# Patient Record
Sex: Female | Born: 1965 | Race: White | Hispanic: No | Marital: Married | Smoking: Current every day smoker
Health system: Southern US, Community
[De-identification: ages and names within clinical notes are randomized; demographics above are authoritative.]

## PROBLEM LIST (undated history)

## (undated) DIAGNOSIS — G43909 Migraine, unspecified, not intractable, without status migrainosus: Secondary | ICD-10-CM

## (undated) HISTORY — PX: TMJ ARTHROPLASTY: SHX1066

## (undated) HISTORY — PX: BREAST ENHANCEMENT SURGERY: SHX7

---

## 2002-05-14 ENCOUNTER — Encounter: Payer: Self-pay | Admitting: Emergency Medicine

## 2002-05-14 ENCOUNTER — Emergency Department (HOSPITAL_COMMUNITY): Admission: EM | Admit: 2002-05-14 | Discharge: 2002-05-14 | Payer: Self-pay | Admitting: Emergency Medicine

## 2004-03-22 ENCOUNTER — Other Ambulatory Visit: Admission: RE | Admit: 2004-03-22 | Discharge: 2004-03-22 | Payer: Self-pay | Admitting: Obstetrics and Gynecology

## 2004-12-26 ENCOUNTER — Emergency Department (HOSPITAL_COMMUNITY): Admission: EM | Admit: 2004-12-26 | Discharge: 2004-12-26 | Payer: Self-pay | Admitting: Emergency Medicine

## 2009-01-26 ENCOUNTER — Emergency Department (HOSPITAL_COMMUNITY): Admission: EM | Admit: 2009-01-26 | Discharge: 2009-01-26 | Payer: Self-pay | Admitting: Emergency Medicine

## 2010-07-23 ENCOUNTER — Encounter: Admission: RE | Admit: 2010-07-23 | Discharge: 2010-07-23 | Payer: Self-pay | Admitting: General Surgery

## 2010-07-26 ENCOUNTER — Ambulatory Visit (HOSPITAL_BASED_OUTPATIENT_CLINIC_OR_DEPARTMENT_OTHER): Admission: RE | Admit: 2010-07-26 | Discharge: 2010-07-26 | Payer: Self-pay | Admitting: General Surgery

## 2011-02-18 LAB — POCT HEMOGLOBIN-HEMACUE: Hemoglobin: 13 g/dL (ref 12.0–15.0)

## 2011-07-15 ENCOUNTER — Emergency Department (HOSPITAL_COMMUNITY)
Admission: EM | Admit: 2011-07-15 | Discharge: 2011-07-15 | Disposition: A | Discharge status: Home / Self Care | Payer: Medicaid Other | Attending: Emergency Medicine | Admitting: Emergency Medicine

## 2011-07-15 DIAGNOSIS — K029 Dental caries, unspecified: Secondary | ICD-10-CM | POA: Insufficient documentation

## 2011-07-15 DIAGNOSIS — K089 Disorder of teeth and supporting structures, unspecified: Secondary | ICD-10-CM | POA: Insufficient documentation

## 2011-07-15 DIAGNOSIS — K047 Periapical abscess without sinus: Secondary | ICD-10-CM | POA: Insufficient documentation

## 2011-10-11 ENCOUNTER — Emergency Department (HOSPITAL_COMMUNITY)
Admission: EM | Admit: 2011-10-11 | Discharge: 2011-10-11 | Disposition: A | Discharge status: Other Acute Inpatient Hospital | Payer: Medicaid Other | Attending: Emergency Medicine | Admitting: Emergency Medicine

## 2011-10-11 ENCOUNTER — Encounter: Payer: Self-pay | Admitting: *Deleted

## 2011-10-11 DIAGNOSIS — R51 Headache: Secondary | ICD-10-CM | POA: Insufficient documentation

## 2011-10-11 DIAGNOSIS — M26609 Unspecified temporomandibular joint disorder, unspecified side: Secondary | ICD-10-CM | POA: Insufficient documentation

## 2011-10-11 DIAGNOSIS — H9209 Otalgia, unspecified ear: Secondary | ICD-10-CM | POA: Insufficient documentation

## 2011-10-11 MED ORDER — HYDROCODONE-ACETAMINOPHEN 5-500 MG PO TABS: 1.0000 | ORAL_TABLET | Freq: Four times a day (QID) | ORAL | Status: AC | PRN

## 2011-10-11 NOTE — ED Provider Notes (Signed)
History     CSN: [id number] Arrival date & time: 10/11/2011  9:42 AM   First MD Initiated Contact with Patient 10/11/11 [phone]      Chief Complaint  Patient presents with  . Headache    (Consider location/radiation/quality/duration/timing/severity/associated sxs/prior treatment) Patient is a 45 y.o. female presenting with headaches.  Headache  This is a new problem. The current episode started more than 1 week ago. Episode frequency: at night only. The problem has not changed since onset.Associated with: opening mouth, biting down. The pain is located in the right unilateral and temporal region. The pain is at a severity of 8/10. The pain is moderate. The pain radiates to the face. Pertinent negatives include no anorexia, no fever, no nausea and no vomiting. She has tried NSAIDs for the symptoms. The treatment provided mild relief.  pt states she had similar pain a month ago, thought it was related to a bad tooth. States had that tooth extracted, pain returned. States it is worse at night. Admits to grinding teeth at night. 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 States has not bothered her until now. Denies visual changes, pain in her neck, nausea, vomiting, dizziness, naumbness or weakness in UE or LE, fever.  History reviewed. No pertinent past medical history.  Past Surgical History  Procedure Date  . Breast enhancement surgery   . Tmj arthroplasty     No family history on file.  History  Substance Use Topics  . Smoking status: Current Everyday Smoker  . Smokeless tobacco: Not on file  . Alcohol Use: No    OB History    Grav Para Term Preterm Abortions TAB SAB Ect Mult Living                  Review of Systems  Constitutional: Negative.  Negative for fever.  HENT: Positive for ear pain. Negative for hearing loss, congestion, sore throat, facial swelling, mouth sores, trouble swallowing, neck pain, voice change and tinnitus.    Eyes: Negative.   Respiratory: Negative.   Cardiovascular: Negative.   Gastrointestinal: Negative.  Negative for nausea, vomiting and anorexia.  Genitourinary: Negative.   Musculoskeletal: Negative.   Skin: Negative.   Neurological: Positive for headaches. Negative for dizziness, facial asymmetry, speech difficulty and numbness.  Psychiatric/Behavioral: Negative.     Allergies  Haldol  Home Medications   Current Outpatient Rx  Name Route Sig Dispense Refill  . BUPROPION HCL ER (SR) 200 MG PO TB12 Oral Take 200 mg by mouth 2 (two) times daily.      . IBUPROFEN 200 MG PO TABS Oral Take 800 mg by mouth every 6 (six) hours as needed. For pain       BP 112/60  Pulse 66  Temp(Src) 98.5 F (36.9 C) (Oral)  SpO2 100%  Physical Exam  Constitutional: She is oriented to person, place, and time. She appears well-developed and well-nourished. No distress.  HENT:  Head: Normocephalic and atraumatic.  Right Ear: External ear normal.  Left Ear: External ear normal.  Nose: Nose normal.  Mouth/Throat: Oropharynx is clear and moist.       Tenderness with palpation over right TMJ. Full ROM of the jaw. No crepitus of TMJ palpated. Normal dentition and gums  Eyes: Conjunctivae and EOM are normal. Pupils are equal, round, and reactive to light.  Neck: Neck supple.  Cardiovascular: Normal rate, regular rhythm, normal heart sounds and intact distal pulses.   Pulmonary/Chest:  Effort normal and breath sounds normal.  Abdominal: Soft. Bowel sounds are normal. There is no tenderness.  Musculoskeletal: Normal range of motion.  Neurological: She is alert and oriented to person, place, and time. She has normal reflexes. No cranial nerve deficit. Coordination normal.       Normal and equal grip strength bilaterally. Gait normal. No pronator drift.  Skin: Skin is warm and dry.  Psychiatric: She has a normal mood and affect. Her behavior is normal.    ED Course  Procedures (including critical care  time)  Normal neurological exam. Pain reporducible with TMJ palpation and jaw movement. Suspect TMJ syndrome VS trigeminal neuralgia. WIll follow up with ENT.   MDM          Lottie Mussel, PA 10/11/11 1025

## 2011-10-11 NOTE — ED Provider Notes (Signed)
Medical screening examination/treatment/procedure(s) were performed by non-physician practitioner and as supervising physician I was immediately available for consultation/collaboration.   Lyanne Co, MD 10/11/11 [phone]

## 2011-10-11 NOTE — ED Notes (Addendum)
Patient reports she had a right sided headache that woke her today.

## 2012-03-26 ENCOUNTER — Other Ambulatory Visit: Payer: Self-pay | Admitting: Obstetrics and Gynecology

## 2012-05-09 IMAGING — CR DG CHEST 2V
2 series · 2 of 2 positions shown · non-contrast
Comparison: None

CLINICAL DATA: Preop respiratory exam.  Hemorrhoids.

CHEST - 2 VIEW

[view not recorded (1 of 2)]
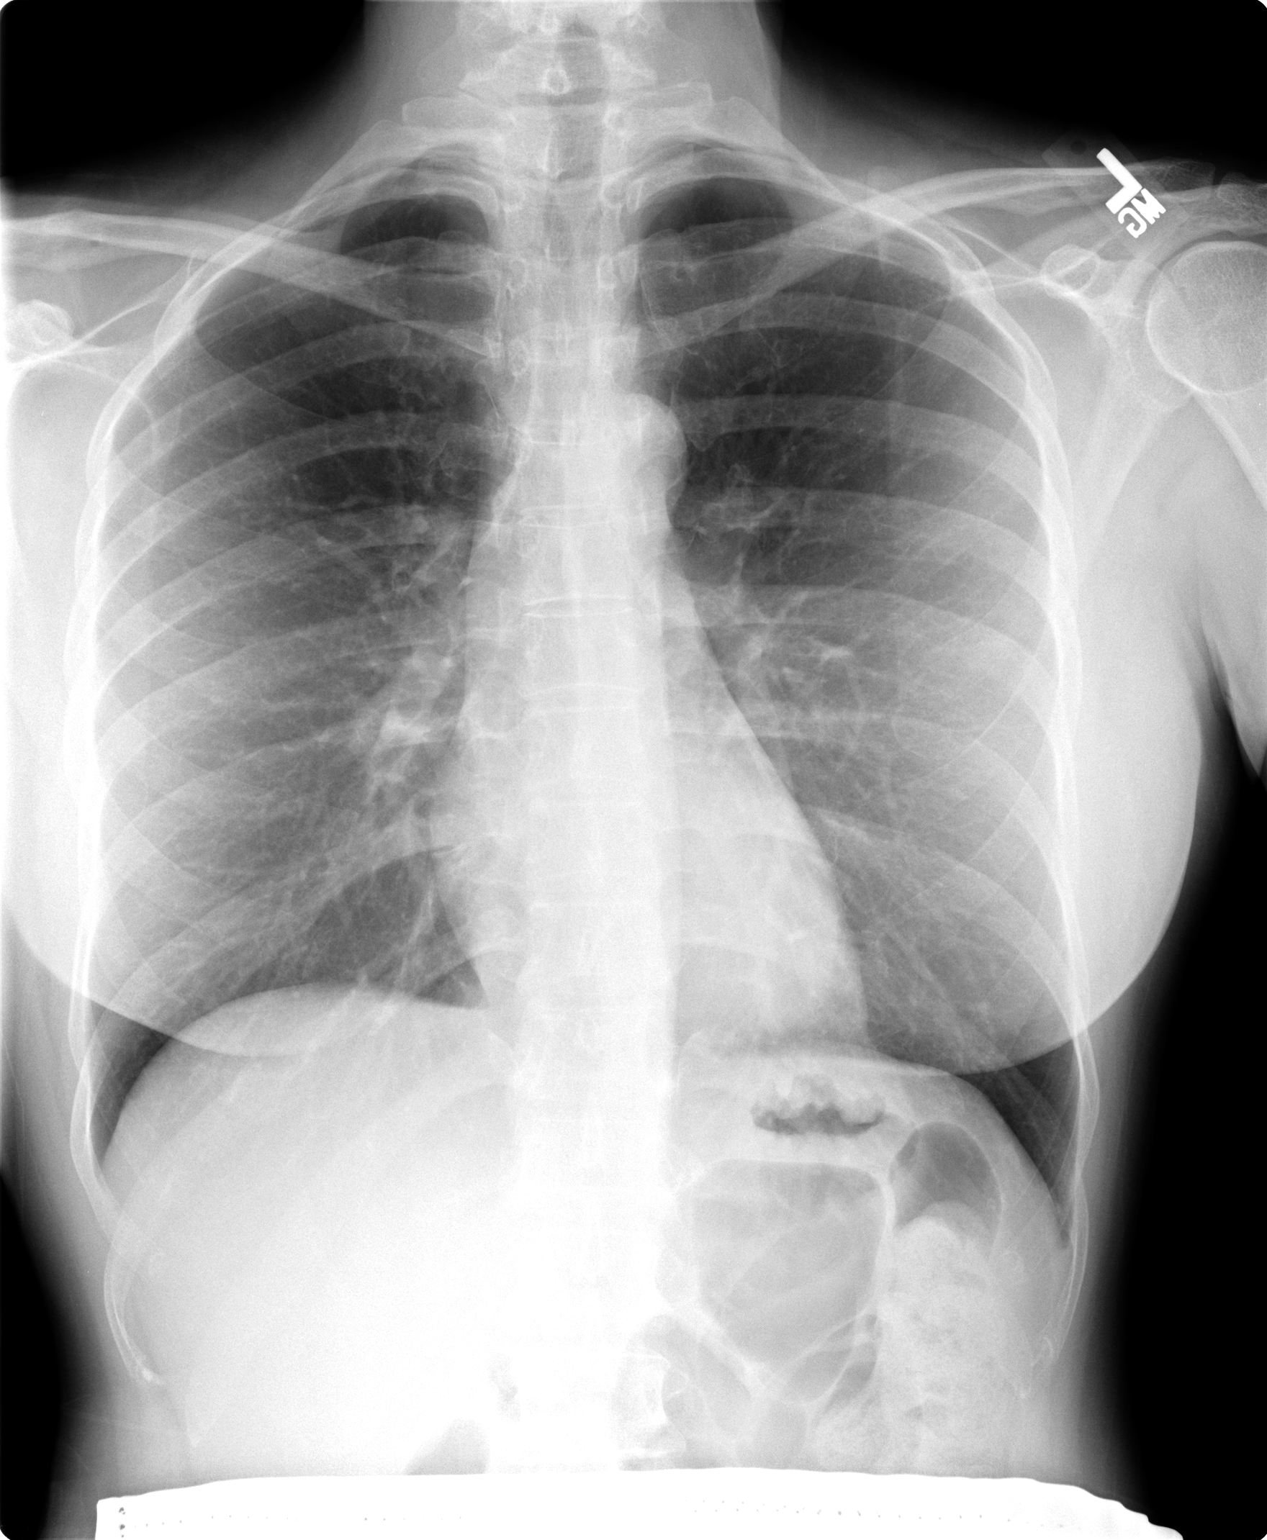

[view not recorded (2 of 2)]
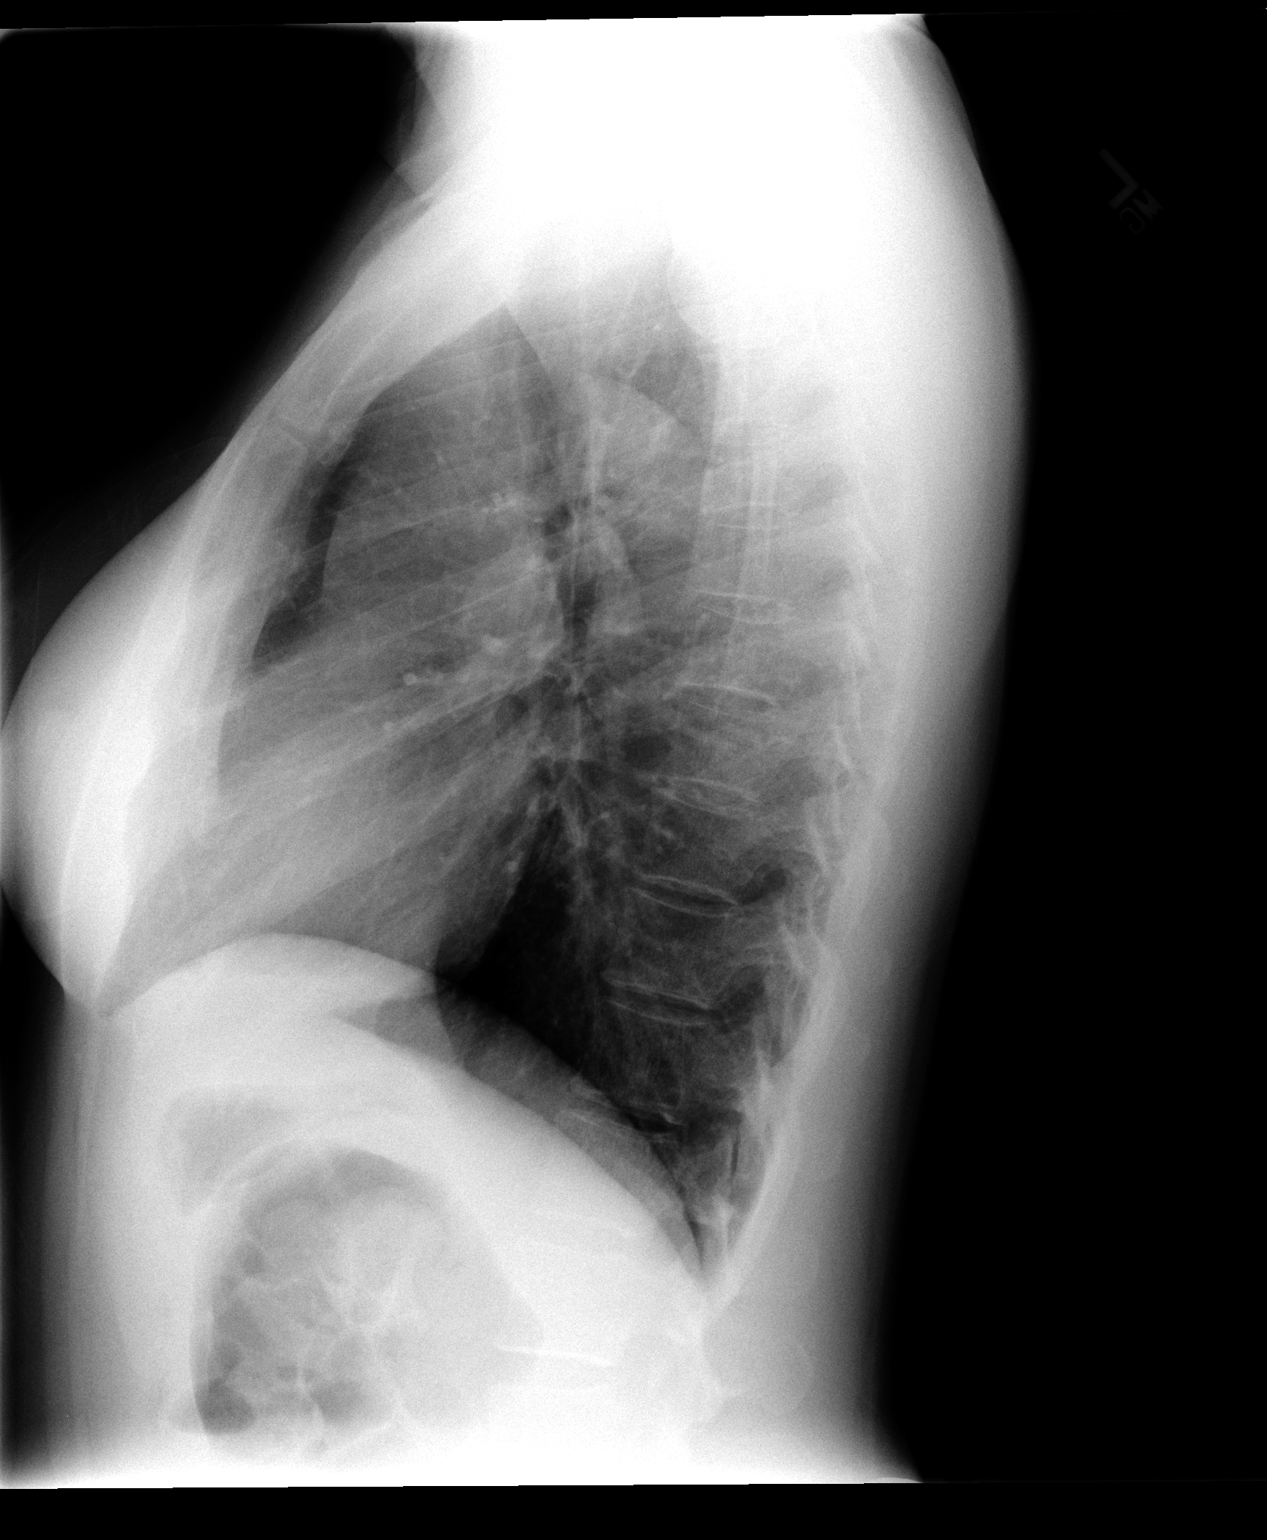

[2 of 2 positions shown; findings below may reference images not displayed]

FINDINGS: Heart size is normal.  Pulmonary vascularity is normal.
The lungs are clear without infiltrate or effusion.  There is no
mass lesion.  No bony abnormalities detected.
IMPRESSION: Negative

## 2013-10-29 ENCOUNTER — Other Ambulatory Visit: Payer: Self-pay | Admitting: Obstetrics and Gynecology

## 2013-10-30 ENCOUNTER — Other Ambulatory Visit: Payer: Self-pay | Admitting: Obstetrics and Gynecology

## 2013-10-30 DIAGNOSIS — Z9882 Breast implant status: Secondary | ICD-10-CM

## 2013-10-30 DIAGNOSIS — N644 Mastodynia: Secondary | ICD-10-CM

## 2013-11-12 ENCOUNTER — Ambulatory Visit
Admission: RE | Admit: 2013-11-12 | Discharge: 2013-11-12 | Disposition: A | Discharge status: Home / Self Care | Payer: Medicaid Other | Source: Ambulatory Visit | Attending: Obstetrics and Gynecology | Admitting: Obstetrics and Gynecology

## 2013-11-12 DIAGNOSIS — N644 Mastodynia: Secondary | ICD-10-CM

## 2013-11-12 DIAGNOSIS — Z9882 Breast implant status: Secondary | ICD-10-CM

## 2015-12-26 ENCOUNTER — Emergency Department (HOSPITAL_COMMUNITY): Payer: 59

## 2015-12-26 ENCOUNTER — Emergency Department (HOSPITAL_COMMUNITY)
Admission: EM | Admit: 2015-12-26 | Discharge: 2015-12-26 | Disposition: A | Discharge status: Home / Self Care | Payer: 59 | Attending: Emergency Medicine | Admitting: Emergency Medicine

## 2015-12-26 ENCOUNTER — Encounter (HOSPITAL_COMMUNITY): Payer: Self-pay | Admitting: *Deleted

## 2015-12-26 DIAGNOSIS — J069 Acute upper respiratory infection, unspecified: Secondary | ICD-10-CM | POA: Insufficient documentation

## 2015-12-26 DIAGNOSIS — Z8679 Personal history of other diseases of the circulatory system: Secondary | ICD-10-CM | POA: Insufficient documentation

## 2015-12-26 DIAGNOSIS — Z79899 Other long term (current) drug therapy: Secondary | ICD-10-CM | POA: Insufficient documentation

## 2015-12-26 DIAGNOSIS — F17210 Nicotine dependence, cigarettes, uncomplicated: Secondary | ICD-10-CM | POA: Insufficient documentation

## 2015-12-26 DIAGNOSIS — R51 Headache: Secondary | ICD-10-CM | POA: Diagnosis not present

## 2015-12-26 DIAGNOSIS — R05 Cough: Secondary | ICD-10-CM | POA: Diagnosis present

## 2015-12-26 DIAGNOSIS — R519 Headache, unspecified: Secondary | ICD-10-CM

## 2015-12-26 HISTORY — DX: Migraine, unspecified, not intractable, without status migrainosus: G43.909

## 2015-12-26 MED ORDER — BENZONATATE 100 MG PO CAPS: 100.0000 mg | ORAL_CAPSULE | Freq: Three times a day (TID) | ORAL | Status: AC

## 2015-12-26 MED ORDER — DEXAMETHASONE SODIUM PHOSPHATE 10 MG/ML IJ SOLN
10.0000 mg | Freq: Once | INTRAMUSCULAR | Status: AC
  Administered 2015-12-26: 10 mg via INTRAMUSCULAR
  Filled 2015-12-26: qty 1

## 2015-12-26 MED ORDER — ALBUTEROL SULFATE HFA 108 (90 BASE) MCG/ACT IN AERS
2.0000 | INHALATION_SPRAY | Freq: Once | RESPIRATORY_TRACT | Status: AC
  Administered 2015-12-26: 2 via RESPIRATORY_TRACT
  Filled 2015-12-26: qty 6.7

## 2015-12-26 NOTE — Discharge Instructions (Signed)
Tessalon as prescribed as needed for cough. Continue tylenol and/motrin for body aches and fever. Follow up with primary care doctor for recheck. Return if worsening.    Upper Respiratory Infection, Adult Most upper respiratory infections (URIs) are a viral infection of the air passages leading to the lungs. A URI affects the nose, throat, and upper air passages. The most common type of URI is nasopharyngitis and is typically referred to as "the common cold." URIs run their course and usually go away on their own. Most of the time, a URI does not require medical attention, but sometimes a bacterial infection in the upper airways can follow a viral infection. This is called a secondary infection. Sinus and middle ear infections are common types of secondary upper respiratory infections. Bacterial pneumonia can also complicate a URI. A URI can worsen asthma and chronic obstructive pulmonary disease (COPD). Sometimes, these complications can require emergency medical care and may be life threatening.  CAUSES Almost all URIs are caused by viruses. A virus is a type of germ and can spread from one person to another.  RISKS FACTORS You may be at risk for a URI if:   You smoke.   You have chronic heart or lung disease.  You have a weakened defense (immune) system.   You are very young or very old.   You have nasal allergies or asthma.  You work in crowded or poorly ventilated areas.  You work in health care facilities or schools. SIGNS AND SYMPTOMS  Symptoms typically develop 2-3 days after you come in contact with a cold virus. Most viral URIs last 7-10 days. However, viral URIs from the influenza virus (flu virus) can last 14-18 days and are typically more severe. Symptoms may include:   Runny or stuffy (congested) nose.   Sneezing.   Cough.   Sore throat.   Headache.   Fatigue.   Fever.   Loss of appetite.   Pain in your forehead, behind your eyes, and over your  cheekbones (sinus pain).  Muscle aches.  DIAGNOSIS  Your health care provider may diagnose a URI by:  Physical exam.  Tests to check that your symptoms are not due to another condition such as:  Strep throat.  Sinusitis.  Pneumonia.  Asthma. TREATMENT  A URI goes away on its own with time. It cannot be cured with medicines, but medicines may be prescribed or recommended to relieve symptoms. Medicines may help:  Reduce your fever.  Reduce your cough.  Relieve nasal congestion. HOME CARE INSTRUCTIONS   Take medicines only as directed by your health care provider.   Gargle warm saltwater or take cough drops to comfort your throat as directed by your health care provider.  Use a warm mist humidifier or inhale steam from a shower to increase air moisture. This may make it easier to breathe.  Drink enough fluid to keep your urine clear or pale yellow.   Eat soups and other clear broths and maintain good nutrition.   Rest as needed.   Return to work when your temperature has returned to normal or as your health care provider advises. You may need to stay home longer to avoid infecting others. You can also use a face mask and careful hand washing to prevent spread of the virus.  Increase the usage of your inhaler if you have asthma.   Do not use any tobacco products, including cigarettes, chewing tobacco, or electronic cigarettes. If you need help quitting, ask your health care  provider. PREVENTION  The best way to protect yourself from getting a cold is to practice good hygiene.   Avoid oral or hand contact with people with cold symptoms.   Wash your hands often if contact occurs.  There is no clear evidence that vitamin C, vitamin E, echinacea, or exercise reduces the chance of developing a cold. However, it is always recommended to get plenty of rest, exercise, and practice good nutrition.  SEEK MEDICAL CARE IF:   You are getting worse rather than better.    Your symptoms are not controlled by medicine.   You have chills.  You have worsening shortness of breath.  You have brown or red mucus.  You have yellow or brown nasal discharge.  You have pain in your face, especially when you bend forward.  You have a fever.  You have swollen neck glands.  You have pain while swallowing.  You have white areas in the back of your throat. SEEK IMMEDIATE MEDICAL CARE IF:   You have severe or persistent:  Headache.  Ear pain.  Sinus pain.  Chest pain.  You have chronic lung disease and any of the following:  Wheezing.  Prolonged cough.  Coughing up blood.  A change in your usual mucus.  You have a stiff neck.  You have changes in your:  Vision.  Hearing.  Thinking.  Mood. MAKE SURE YOU:   Understand these instructions.  Will watch your condition.  Will get help right away if you are not doing well or get worse.   This information is not intended to replace advice given to you by your health care provider. Make sure you discuss any questions you have with your health care provider.   Document Released: 05/17/2001 Document Revised: 04/07/2015 Document Reviewed: 02/26/2014 Elsevier Interactive Patient Education [employer].

## 2015-12-26 NOTE — ED Provider Notes (Signed)
CSN: [id number]     Arrival date & time 12/26/15  4098 History   First MD Initiated Contact with Patient 12/26/15 [phone]     Chief Complaint  Patient presents with  . Migraine  . Cough     (Consider location/radiation/quality/duration/timing/severity/associated sxs/prior Treatment) HPI Christie Hess is a 50 y.o. female  With a history of migraine headaches, presents to emergency department complaining of flulike symptoms and a headache. Patient states she has had cough, congestion, body aches, fever for 2 days. She reports fever 106.1 2 days ago. She states "I was hallucinating." patient reports taking TheraFlu day and night for her symptoms. She states this morning she woke up with one of her " cluster headaches." She states is typical for her prior cluster headache. Reports sharp pain to the right side of the head. She reports associated tunnel vision. She states when she developed a headache she decided to come to the ER. On the way to the hospital headache improved. Patient states her main concern now is the cough. She reports pain in her chest with coughing only. She reports yellow thick sputum. She denies any shortness of breath. No nausea, vomiting. No diarrhea. No abdominal pain. No swelling or pain in extremities.  Past Medical History  Diagnosis Date  . Migraines    Past Surgical History  Procedure Laterality Date  . Breast enhancement surgery    . Tmj arthroplasty     No family history on file. Social History  Substance Use Topics  . Smoking status: Current Every Day Smoker -- 0.50 packs/day    Types: Cigarettes  . Smokeless tobacco: None  . Alcohol Use: No   OB History    No data available     Review of Systems  Constitutional: Negative for fever and chills.  HENT: Positive for congestion and sore throat. Negative for ear pain.   Eyes: Negative.   Respiratory: Positive for cough. Negative for chest tightness and shortness of breath.   Cardiovascular: Negative for  chest pain, palpitations and leg swelling.  Gastrointestinal: Negative for nausea, vomiting, abdominal pain and diarrhea.  Genitourinary: Negative for dysuria, flank pain and pelvic pain.  Musculoskeletal: Negative for myalgias, arthralgias, neck pain and neck stiffness.  Skin: Negative for rash.  Neurological: Positive for headaches. Negative for dizziness and weakness.  All other systems reviewed and are negative.     Allergies  Haldol  Home Medications   Prior to Admission medications   Medication Sig Start Date End Date Taking? Authorizing Provider  buPROPion (WELLBUTRIN SR) 200 MG 12 hr tablet Take 200 mg by mouth 2 (two) times daily.      Historical Provider, MD  ibuprofen (ADVIL,MOTRIN) 200 MG tablet Take 800 mg by mouth every 6 (six) hours as needed. For pain     Historical Provider, MD   BP 137/79 mmHg  Pulse 77  Temp(Src) 99.1 F (37.3 C) (Oral)  Resp 18  Ht  (1.676 m)  Wt 61.236 kg  BMI 21.80 kg/m2  SpO2 98% Physical Exam  Constitutional: She is oriented to person, place, and time. She appears well-developed and well-nourished. No distress.  HENT:  Head: Normocephalic and atraumatic.  Right Ear: Tympanic membrane, external ear and ear canal normal.  Left Ear: Tympanic membrane, external ear and ear canal normal.  Nose: Nose normal.  Mouth/Throat: Uvula is midline, oropharynx is clear and moist and mucous membranes are normal.  Eyes: Conjunctivae and EOM are normal. Pupils are equal, round, and reactive to  light.  Neck: Neck supple.  Cardiovascular: Normal rate, regular rhythm and normal heart sounds.   Pulmonary/Chest: Effort normal and breath sounds normal. No respiratory distress. She has no wheezes. She has no rales.  Abdominal: Soft. Bowel sounds are normal. She exhibits no distension. There is no tenderness. There is no rebound.  Musculoskeletal: She exhibits no edema.  Neurological: She is alert and oriented to person, place, and time.  Skin: Skin is  warm and dry.  Psychiatric: She has a normal mood and affect. Her behavior is normal.  Nursing note and vitals reviewed.   ED Course  Procedures (including critical care time) Labs Review Labs Reviewed - No data to display  Imaging Review Dg Chest 2 View  12/26/2015  CLINICAL DATA:  Cough and fever EXAM: CHEST - 2 VIEW COMPARISON:  07/23/2010 FINDINGS: The heart size and mediastinal contours are within normal limits. Both lungs are clear. The visualized skeletal structures are unremarkable. IMPRESSION: No active disease. Electronically Signed   By: Alcide Clever M.D.   On: 12/26/2015 12:33   I have personally reviewed and evaluated these images and lab results as part of my medical decision-making.   EKG Interpretation None      MDM   Final diagnoses:  URI (upper respiratory infection)  Nonintractable headache, unspecified chronicity pattern, unspecified headache type    patient with flulike symptoms and headache. Headache actually improved on her way to the hospital. Her exam is consistent with viral URI. Chest x-ray obtained to rule out pneumonia and is negative. Patient treated emergency department with Decadron  IM.  We'll discharge home with cough medication, otherwise symptomatic treatment for her viral URI. Follow-up with primary care doctor. Return precautions discussed. Patient is afebrile here, normal vital signs.  Filed Vitals:   12/26/15 1029  BP: 137/79  Pulse: 77  Temp: 99.1 F (37.3 C)  TempSrc: Oral  Resp: 18  Height:  (1.676 m)  Weight: 61.236 kg  SpO2: 98%      Jaynie Crumble, PA-C 12/26/15 1615  Benjiman Core, MD 12/27/15 [phone]

## 2015-12-26 NOTE — ED Notes (Signed)
Pt ambulates independently and with steady gait at time of discharge. Discharge instructions and follow up information reviewed with patient. No other questions or concerns voiced at this time.  

## 2015-12-26 NOTE — ED Notes (Signed)
Pt with cough, headache, fever of 106.1 on Thursday.  States she had hallucinations.

## 2017-10-12 IMAGING — DX DG CHEST 2V
2 series · 2 of 2 positions shown · non-contrast
Comparison: 07/23/2010

CLINICAL DATA: Cough and fever

EXAM:
CHEST - 2 VIEW

[chest pa]
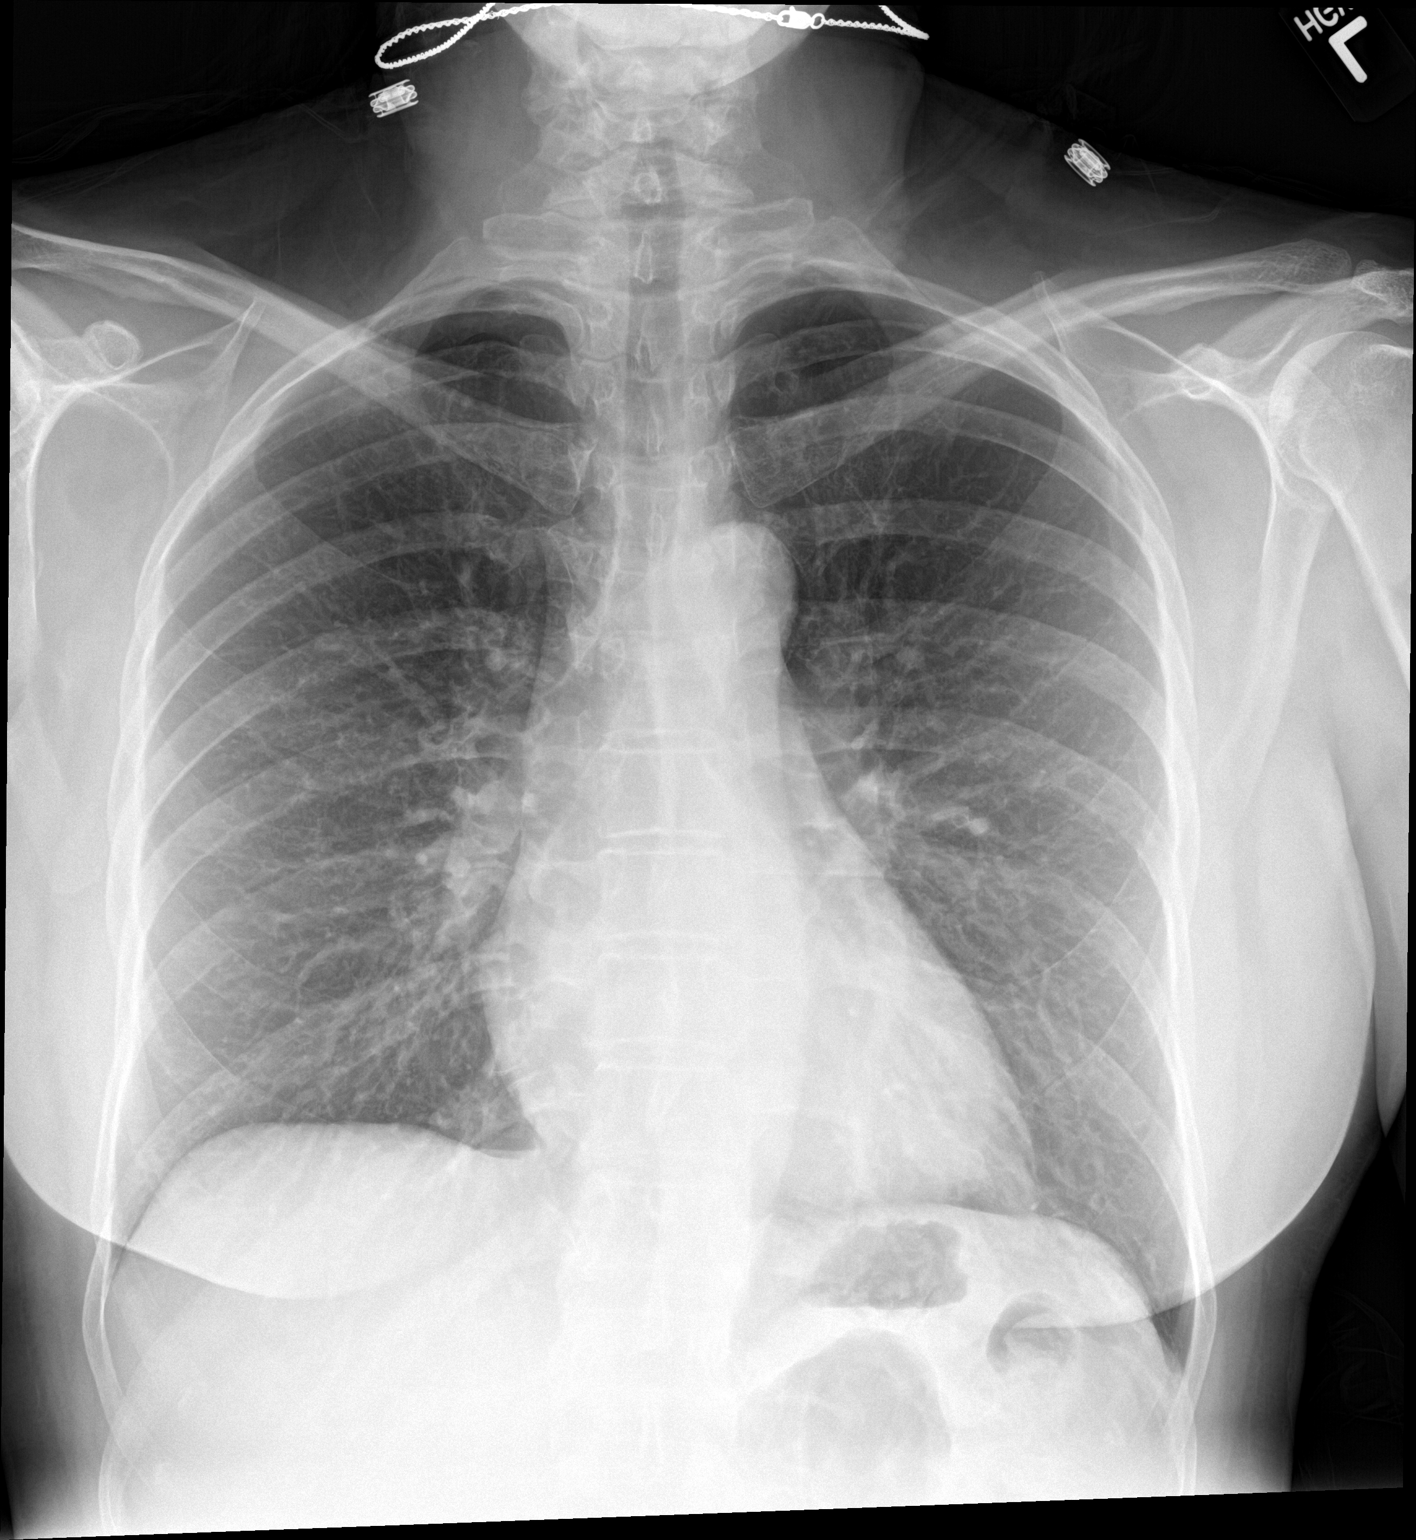

[chest lat]
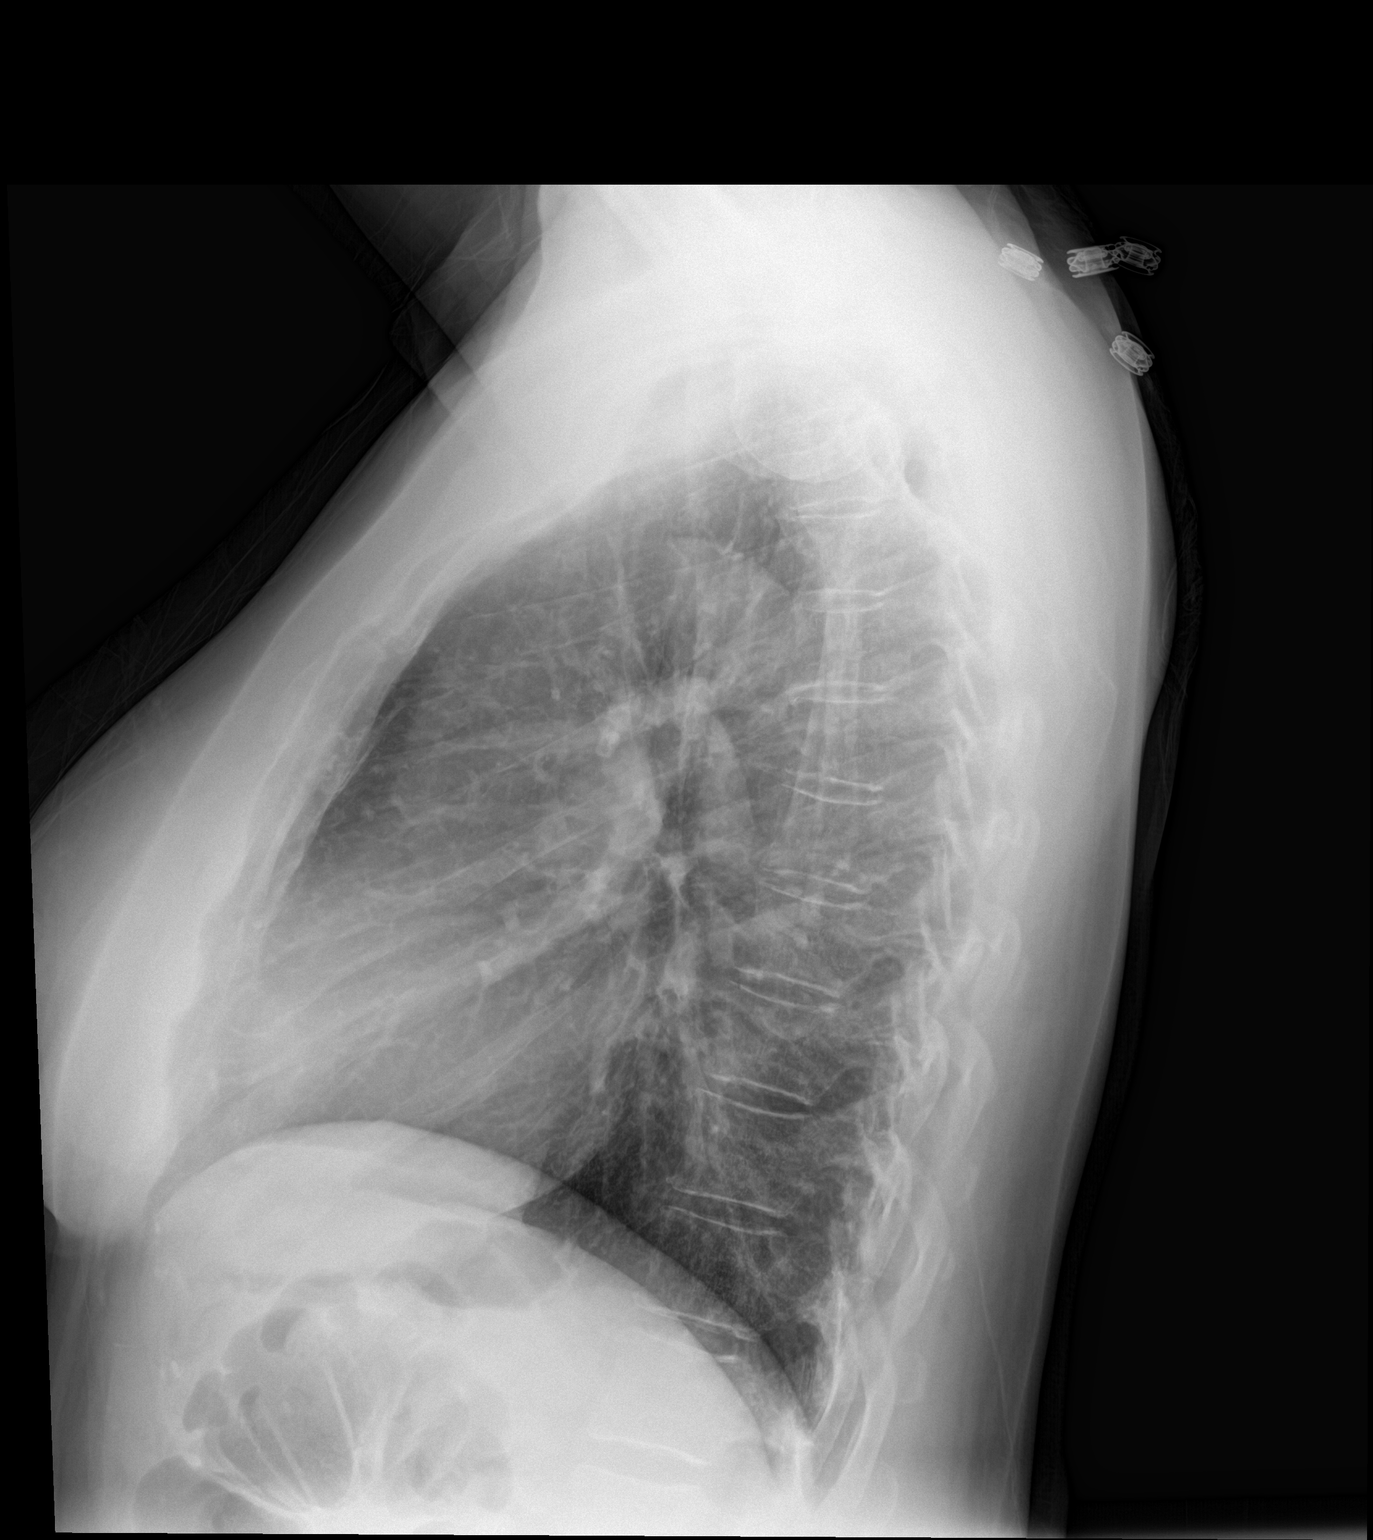

[2 of 2 positions shown; findings below may reference images not displayed]

FINDINGS: The heart size and mediastinal contours are within normal limits.
Both lungs are clear. The visualized skeletal structures are
unremarkable.
IMPRESSION: No active disease.

## 2022-11-04 DIAGNOSIS — Z419 Encounter for procedure for purposes other than remedying health state, unspecified: Secondary | ICD-10-CM | POA: Diagnosis not present

## 2022-12-05 DIAGNOSIS — Z419 Encounter for procedure for purposes other than remedying health state, unspecified: Secondary | ICD-10-CM | POA: Diagnosis not present
